# Patient Record
Sex: Female | Born: 1970 | Race: Black or African American | Hispanic: No | Marital: Single | Smoking: Former smoker
Health system: Southern US, Community
[De-identification: ages and names within clinical notes are randomized; demographics above are authoritative.]

## PROBLEM LIST (undated history)

## (undated) DIAGNOSIS — I10 Essential (primary) hypertension: Secondary | ICD-10-CM

## (undated) HISTORY — PX: LIPOMA EXCISION: SHX5283

---

## 2015-05-10 ENCOUNTER — Other Ambulatory Visit: Payer: Self-pay | Admitting: Obstetrics and Gynecology

## 2015-05-15 ENCOUNTER — Encounter (HOSPITAL_COMMUNITY)
Admission: RE | Admit: 2015-05-15 | Discharge: 2015-05-15 | Disposition: A | Discharge status: Home / Self Care | Payer: BLUE CROSS/BLUE SHIELD | Source: Ambulatory Visit | Attending: Obstetrics and Gynecology | Admitting: Obstetrics and Gynecology

## 2015-05-15 ENCOUNTER — Other Ambulatory Visit: Payer: Self-pay

## 2015-05-15 ENCOUNTER — Encounter (HOSPITAL_COMMUNITY): Payer: Self-pay

## 2015-05-15 DIAGNOSIS — D259 Leiomyoma of uterus, unspecified: Secondary | ICD-10-CM | POA: Insufficient documentation

## 2015-05-15 DIAGNOSIS — Z01818 Encounter for other preprocedural examination: Secondary | ICD-10-CM | POA: Diagnosis present

## 2015-05-15 HISTORY — DX: Essential (primary) hypertension: I10

## 2015-05-15 LAB — BASIC METABOLIC PANEL
Anion gap: 3 — ABNORMAL LOW (ref 5–15)
BUN: 7 mg/dL (ref 6–20)
CALCIUM: 8.7 mg/dL — AB (ref 8.9–10.3)
CO2: 25 mmol/L (ref 22–32)
CREATININE: 0.64 mg/dL (ref 0.44–1.00)
Chloride: 107 mmol/L (ref 101–111)
Glucose, Bld: 103 mg/dL — ABNORMAL HIGH (ref 65–99)
Potassium: 3.6 mmol/L (ref 3.5–5.1)
SODIUM: 135 mmol/L (ref 135–145)

## 2015-05-15 LAB — CBC
HCT: 30.7 % — ABNORMAL LOW (ref 36.0–46.0)
Hemoglobin: 9.6 g/dL — ABNORMAL LOW (ref 12.0–15.0)
MCH: 23.8 pg — AB (ref 26.0–34.0)
MCHC: 31.3 g/dL (ref 30.0–36.0)
MCV: 76 fL — ABNORMAL LOW (ref 78.0–100.0)
PLATELETS: 432 10*3/uL — AB (ref 150–400)
RBC: 4.04 MIL/uL (ref 3.87–5.11)
RDW: 17.3 % — ABNORMAL HIGH (ref 11.5–15.5)
WBC: 7.6 10*3/uL (ref 4.0–10.5)

## 2015-05-15 LAB — ABO/RH: ABO/RH(D): O POS

## 2015-05-15 NOTE — Patient Instructions (Addendum)
   Your procedure is scheduled on: OCT 26 [facility])  Enter through the Main Entrance of [facility] at: [place] up the phone at the desk and dial [phone] and inform us of your arrival.  Please call this number if you have any problems the morning of surgery: [phone]   DO NOT EAT  OR DRINK AFTER MIDNIGHT OCT 25 (TUESDAY)  Take these medicines the morning of surgery with a SIP OF WATER: TAKE BLOOD PRESSURE MEDS AM OF SURGERY  Do not wear jewelry, make-up, or FINGER nail polish No metal in your hair or on your body. Do not wear lotions, powders, perfumes.  You may wear deodorant.  Do not bring valuables to the hospital. Contacts, dentures or bridgework may not be worn into surgery.  Leave suitcase in the car. After Surgery it may be brought to your room. For patients being admitted to the hospital, checkout time is 11:00am the day of discharge.

## 2015-05-22 MED ORDER — CEFAZOLIN SODIUM 10 G IJ SOLR
3.0000 g | INTRAMUSCULAR | Status: AC
  Administered 2015-05-23: 3 g via INTRAVENOUS
  Filled 2015-05-22: qty 3000

## 2015-05-22 NOTE — H&P (Signed)
44 y.o. yo G1P0 complains of, on current birth control- norethindrone-, incredibly heavy and painful periods. Last 2 months has had two per month.  No inter-menstrual bleeding.  Was referred to [facility] and told should have hysterectomy. [place] at [facility] 12 weeks size with 6 cm and 8 cm fibroid. EM not seen on [place] but EMB was benign. No pain with SA. Pt has anemia and keeping up but trouble tolerating iron. NO transfusion.  Pt had been seen for possible fertility but AMA was out of range for IVF.  Pt does not desire to preserve uterus and desires definitive.  Past Medical History  Diagnosis Date  . Hypertension    Past Surgical History  Procedure Laterality Date  . Lipoma excision      2005    Social History   Social History  . Marital Status: Single    Spouse Name: N/A  . Number of Children: N/A  . Years of Education: N/A   Occupational History  . Not on file.   Social History Main Topics  . Smoking status: Former Smoker -- 0.50 packs/day    Quit date: 05/15/1991  . Smokeless tobacco: Never Used  . Alcohol Use: No  . Drug Use: No  . Sexual Activity: Not on file   Other Topics Concern  . Not on file   Social History Narrative  . No narrative on file    No current facility-administered medications on file prior to encounter.   No current outpatient prescriptions on file prior to encounter.    Allergies  Allergen Reactions  . Kiwi Extract Itching    throat    @VITALS2 @  Lungs: clear to ascultation Cor:  RRR Abdomen:  soft, nontender, nondistended. Ex:  no cords, erythema Pelvic:  Vulva: no masses, atrophy, or lesions. Vagina: no tenderness, erythema, cystocele, rectocele, abnormal vaginal discharge, or vesicle(s) or ulcers. Cervix: no discharge or cervical motion tenderness and grossly normal. Uterus: enlarged and fibroids and midline, non-tender, and no uterine prolapse. Bladder/Urethra: no urethral discharge or mass and normal meatus, bladder non distended, and Urethra well  supported. Adnexa/Parametria: no parametrial tenderness or mass and no adnexal tenderness or ovarian mass.  A:  Symptomatic fibroid uterus with anemia.  Pt desires definitive.  Will NOT do power morcellation.  For TLH/BSalpingectomies/cysto.  Will have T&C 2 units. Will need to check hands and put in position of function during surgery for ulnar nerve injury.    P: All risks, benefits and alternatives d/w patient and she desires to proceed.  Patient has undergone a modified bowel prep and will receive preop antibiotics and SCDs during the operation.     Lodie Waheed A

## 2015-05-23 ENCOUNTER — Ambulatory Visit (HOSPITAL_COMMUNITY): Payer: BLUE CROSS/BLUE SHIELD | Admitting: Anesthesiology

## 2015-05-23 ENCOUNTER — Encounter (HOSPITAL_COMMUNITY)
Admission: RE | Disposition: A | Discharge status: Home / Self Care | Payer: Self-pay | Source: Ambulatory Visit | Attending: Obstetrics and Gynecology

## 2015-05-23 ENCOUNTER — Ambulatory Visit (HOSPITAL_COMMUNITY)
Admission: RE | Admit: 2015-05-23 | Discharge: 2015-05-24 | Disposition: A | Discharge status: Home / Self Care | Payer: BLUE CROSS/BLUE SHIELD | Source: Ambulatory Visit | Attending: Obstetrics and Gynecology | Admitting: Obstetrics and Gynecology

## 2015-05-23 ENCOUNTER — Encounter (HOSPITAL_COMMUNITY): Payer: Self-pay | Admitting: Anesthesiology

## 2015-05-23 DIAGNOSIS — N99821 Postprocedural hemorrhage and hematoma of a genitourinary system organ or structure following other procedure: Secondary | ICD-10-CM | POA: Diagnosis not present

## 2015-05-23 DIAGNOSIS — Z6841 Body Mass Index (BMI) 40.0 and over, adult: Secondary | ICD-10-CM | POA: Insufficient documentation

## 2015-05-23 DIAGNOSIS — Z87891 Personal history of nicotine dependence: Secondary | ICD-10-CM | POA: Diagnosis not present

## 2015-05-23 DIAGNOSIS — T8131XA Disruption of external operation (surgical) wound, not elsewhere classified, initial encounter: Secondary | ICD-10-CM | POA: Insufficient documentation

## 2015-05-23 DIAGNOSIS — D259 Leiomyoma of uterus, unspecified: Secondary | ICD-10-CM | POA: Insufficient documentation

## 2015-05-23 DIAGNOSIS — D649 Anemia, unspecified: Secondary | ICD-10-CM | POA: Insufficient documentation

## 2015-05-23 DIAGNOSIS — I10 Essential (primary) hypertension: Secondary | ICD-10-CM | POA: Diagnosis not present

## 2015-05-23 DIAGNOSIS — N946 Dysmenorrhea, unspecified: Secondary | ICD-10-CM | POA: Diagnosis not present

## 2015-05-23 DIAGNOSIS — Z9889 Other specified postprocedural states: Secondary | ICD-10-CM

## 2015-05-23 HISTORY — PX: ROBOTIC ASSISTED TOTAL HYSTERECTOMY WITH BILATERAL SALPINGO OOPHERECTOMY: SHX6086

## 2015-05-23 HISTORY — PX: LAPAROSCOPY: SHX197

## 2015-05-23 HISTORY — PX: BILATERAL SALPINGECTOMY: SHX5743

## 2015-05-23 HISTORY — PX: CYSTOSCOPY: SHX5120

## 2015-05-23 LAB — PREGNANCY, URINE: Preg Test, Ur: NEGATIVE

## 2015-05-23 LAB — PREPARE RBC (CROSSMATCH)

## 2015-05-23 SURGERY — ROBOTIC ASSISTED TOTAL HYSTERECTOMY WITH BILATERAL SALPINGO OOPHORECTOMY
Anesthesia: General | Site: Bladder

## 2015-05-23 MED ORDER — KETOROLAC TROMETHAMINE 0.5 % OP SOLN
1.0000 [drp] | Freq: Three times a day (TID) | OPHTHALMIC | Status: AC | PRN
  Filled 2015-05-23: qty 3

## 2015-05-23 MED ORDER — OXYCODONE-ACETAMINOPHEN 5-325 MG PO TABS
1.0000 | ORAL_TABLET | ORAL | Status: DC | PRN
  Administered 2015-05-24: 1 via ORAL
  Filled 2015-05-23: qty 1

## 2015-05-23 MED ORDER — LIDOCAINE HCL (CARDIAC) 20 MG/ML IV SOLN
INTRAVENOUS | Status: AC
  Filled 2015-05-23: qty 5

## 2015-05-23 MED ORDER — MENTHOL 3 MG MT LOZG: 1.0000 | LOZENGE | OROMUCOSAL | Status: DC | PRN

## 2015-05-23 MED ORDER — PROPOFOL 10 MG/ML IV BOLUS
INTRAVENOUS | Status: AC
  Filled 2015-05-23: qty 20

## 2015-05-23 MED ORDER — SODIUM CHLORIDE 0.9 % IJ SOLN
INTRAMUSCULAR | Status: AC
  Filled 2015-05-23: qty 50

## 2015-05-23 MED ORDER — VASOPRESSIN 20 UNIT/ML IV SOLN
INTRAVENOUS | Status: DC | PRN
  Administered 2015-05-23: 20 mL via INTRAMUSCULAR

## 2015-05-23 MED ORDER — DEXAMETHASONE SODIUM PHOSPHATE 10 MG/ML IJ SOLN
INTRAMUSCULAR | Status: DC | PRN
  Administered 2015-05-23: 4 mg via INTRAVENOUS

## 2015-05-23 MED ORDER — SCOPOLAMINE 1 MG/3DAYS TD PT72
1.0000 | MEDICATED_PATCH | Freq: Once | TRANSDERMAL | Status: DC
  Administered 2015-05-23: 1.5 mg via TRANSDERMAL

## 2015-05-23 MED ORDER — LIDOCAINE HCL (CARDIAC) 20 MG/ML IV SOLN
INTRAVENOUS | Status: DC | PRN
  Administered 2015-05-23: 80 mg via INTRAVENOUS

## 2015-05-23 MED ORDER — LACTATED RINGERS IR SOLN
Status: DC | PRN
  Administered 2015-05-23 (×2): 3000 mL

## 2015-05-23 MED ORDER — LACTATED RINGERS IV SOLN
INTRAVENOUS | Status: DC
  Administered 2015-05-23: 125 mL/h via INTRAVENOUS
  Administered 2015-05-23: 13:00:00 via INTRAVENOUS

## 2015-05-23 MED ORDER — NEOSTIGMINE METHYLSULFATE 10 MG/10ML IV SOLN
INTRAVENOUS | Status: DC | PRN
  Administered 2015-05-23: 5 mg via INTRAVENOUS

## 2015-05-23 MED ORDER — ROCURONIUM BROMIDE 100 MG/10ML IV SOLN
INTRAVENOUS | Status: AC
  Filled 2015-05-23: qty 1

## 2015-05-23 MED ORDER — ONDANSETRON HCL 4 MG PO TABS
4.0000 mg | ORAL_TABLET | Freq: Four times a day (QID) | ORAL | Status: DC | PRN
Start: 2015-05-23 — End: 2015-05-24
  Administered 2015-05-24: 4 mg via ORAL
  Filled 2015-05-23: qty 1

## 2015-05-23 MED ORDER — IBUPROFEN 800 MG PO TABS: 800.0000 mg | ORAL_TABLET | Freq: Three times a day (TID) | ORAL | Status: DC | PRN

## 2015-05-23 MED ORDER — BSS IO SOLN
15.0000 mL | Freq: Once | INTRAOCULAR | Status: AC
  Administered 2015-05-23: 15 mL
  Filled 2015-05-23: qty 15

## 2015-05-23 MED ORDER — EPHEDRINE SULFATE 50 MG/ML IJ SOLN
INTRAMUSCULAR | Status: DC | PRN
  Administered 2015-05-23: 10 mg via INTRAVENOUS

## 2015-05-23 MED ORDER — FENTANYL CITRATE (PF) 100 MCG/2ML IJ SOLN
INTRAMUSCULAR | Status: DC | PRN
  Administered 2015-05-23: 50 ug via INTRAVENOUS
  Administered 2015-05-23: 100 ug via INTRAVENOUS
  Administered 2015-05-23: 50 ug via INTRAVENOUS
  Administered 2015-05-23 (×2): 25 ug via INTRAVENOUS
  Administered 2015-05-23: 100 ug via INTRAVENOUS
  Administered 2015-05-23: 50 ug via INTRAVENOUS
  Administered 2015-05-23 (×2): 100 ug via INTRAVENOUS

## 2015-05-23 MED ORDER — ONDANSETRON HCL 4 MG/2ML IJ SOLN
INTRAMUSCULAR | Status: DC | PRN
  Administered 2015-05-23: 4 mg via INTRAVENOUS

## 2015-05-23 MED ORDER — LABETALOL HCL 5 MG/ML IV SOLN: INTRAVENOUS | Status: DC | PRN

## 2015-05-23 MED ORDER — ONDANSETRON HCL 4 MG/2ML IJ SOLN
INTRAMUSCULAR | Status: AC
  Filled 2015-05-23: qty 2

## 2015-05-23 MED ORDER — SUCCINYLCHOLINE CHLORIDE 20 MG/ML IJ SOLN
INTRAMUSCULAR | Status: AC
  Filled 2015-05-23: qty 1

## 2015-05-23 MED ORDER — LISINOPRIL 10 MG PO TABS
10.0000 mg | ORAL_TABLET | Freq: Every day | ORAL | Status: DC
  Administered 2015-05-24: 10 mg via ORAL
  Filled 2015-05-23 (×2): qty 1

## 2015-05-23 MED ORDER — ROPIVACAINE HCL 5 MG/ML IJ SOLN
INTRAMUSCULAR | Status: AC
  Filled 2015-05-23: qty 30

## 2015-05-23 MED ORDER — PHENYLEPHRINE HCL 10 MG/ML IJ SOLN
INTRAMUSCULAR | Status: DC | PRN
  Administered 2015-05-23: 80 ug via INTRAVENOUS
  Administered 2015-05-23 (×6): 40 ug via INTRAVENOUS
  Administered 2015-05-23: 60 ug via INTRAVENOUS
  Administered 2015-05-23: 40 ug via INTRAVENOUS

## 2015-05-23 MED ORDER — SCOPOLAMINE 1 MG/3DAYS TD PT72
MEDICATED_PATCH | TRANSDERMAL | Status: AC
  Filled 2015-05-23: qty 1

## 2015-05-23 MED ORDER — GLYCOPYRROLATE 0.2 MG/ML IJ SOLN
INTRAMUSCULAR | Status: DC | PRN
  Administered 2015-05-23: .8 mg via INTRAVENOUS

## 2015-05-23 MED ORDER — POLYMYXIN B-TRIMETHOPRIM 10000-0.1 UNIT/ML-% OP SOLN
1.0000 [drp] | Freq: Three times a day (TID) | OPHTHALMIC | Status: AC
  Administered 2015-05-23 – 2015-05-24 (×3): 1 [drp] via OPHTHALMIC
  Filled 2015-05-23: qty 10

## 2015-05-23 MED ORDER — MIDAZOLAM HCL 2 MG/2ML IJ SOLN
INTRAMUSCULAR | Status: AC
  Filled 2015-05-23: qty 4

## 2015-05-23 MED ORDER — ROCURONIUM BROMIDE 100 MG/10ML IV SOLN
INTRAVENOUS | Status: DC | PRN
  Administered 2015-05-23: 5 mg via INTRAVENOUS
  Administered 2015-05-23 (×3): 10 mg via INTRAVENOUS
  Administered 2015-05-23: 5 mg via INTRAVENOUS
  Administered 2015-05-23: 10 mg via INTRAVENOUS
  Administered 2015-05-23: 45 mg via INTRAVENOUS
  Administered 2015-05-23: 10 mg via INTRAVENOUS
  Administered 2015-05-23: 5 mg via INTRAVENOUS

## 2015-05-23 MED ORDER — SUCCINYLCHOLINE CHLORIDE 20 MG/ML IJ SOLN
INTRAMUSCULAR | Status: DC | PRN
  Administered 2015-05-23: 160 mg via INTRAVENOUS

## 2015-05-23 MED ORDER — DEXAMETHASONE SODIUM PHOSPHATE 4 MG/ML IJ SOLN
INTRAMUSCULAR | Status: AC
  Filled 2015-05-23: qty 1

## 2015-05-23 MED ORDER — KETOROLAC TROMETHAMINE 30 MG/ML IJ SOLN: 30.0000 mg | Freq: Four times a day (QID) | INTRAMUSCULAR | Status: DC

## 2015-05-23 MED ORDER — HYDROMORPHONE HCL 1 MG/ML IJ SOLN
INTRAMUSCULAR | Status: AC
  Administered 2015-05-23: 0.25 mg via INTRAVENOUS
  Filled 2015-05-23: qty 1

## 2015-05-23 MED ORDER — PROPOFOL 10 MG/ML IV BOLUS
INTRAVENOUS | Status: DC | PRN
  Administered 2015-05-23: 50 mg via INTRAVENOUS
  Administered 2015-05-23: 200 mg via INTRAVENOUS
  Administered 2015-05-23: 50 mg via INTRAVENOUS

## 2015-05-23 MED ORDER — PHENYLEPHRINE 40 MCG/ML (10ML) SYRINGE FOR IV PUSH (FOR BLOOD PRESSURE SUPPORT)
PREFILLED_SYRINGE | INTRAVENOUS | Status: AC
  Filled 2015-05-23: qty 20

## 2015-05-23 MED ORDER — SODIUM CHLORIDE 0.9 % IV SOLN
INTRAVENOUS | Status: DC | PRN
  Administered 2015-05-23: 60 mL

## 2015-05-23 MED ORDER — MIDAZOLAM HCL 2 MG/2ML IJ SOLN
INTRAMUSCULAR | Status: DC | PRN
  Administered 2015-05-23: 2 mg via INTRAVENOUS

## 2015-05-23 MED ORDER — FENTANYL CITRATE (PF) 100 MCG/2ML IJ SOLN
INTRAMUSCULAR | Status: AC
  Filled 2015-05-23: qty 4

## 2015-05-23 MED ORDER — METOCLOPRAMIDE HCL 5 MG/ML IJ SOLN
INTRAMUSCULAR | Status: AC
  Filled 2015-05-23: qty 2

## 2015-05-23 MED ORDER — ONDANSETRON HCL 4 MG/2ML IJ SOLN: 4.0000 mg | Freq: Four times a day (QID) | INTRAMUSCULAR | Status: DC | PRN

## 2015-05-23 MED ORDER — PHENYLEPHRINE 40 MCG/ML (10ML) SYRINGE FOR IV PUSH (FOR BLOOD PRESSURE SUPPORT)
PREFILLED_SYRINGE | INTRAVENOUS | Status: AC
  Filled 2015-05-23: qty 10

## 2015-05-23 MED ORDER — EPHEDRINE 5 MG/ML INJ
INTRAVENOUS | Status: AC
  Filled 2015-05-23: qty 10

## 2015-05-23 MED ORDER — STERILE WATER FOR IRRIGATION IR SOLN
Status: DC | PRN
  Administered 2015-05-23: 1000 mL via INTRAVESICAL

## 2015-05-23 MED ORDER — ARTIFICIAL TEARS OP OINT
TOPICAL_OINTMENT | OPHTHALMIC | Status: AC
  Filled 2015-05-23: qty 3.5

## 2015-05-23 MED ORDER — GLYCOPYRROLATE 0.2 MG/ML IJ SOLN
INTRAMUSCULAR | Status: AC
  Filled 2015-05-23: qty 4

## 2015-05-23 MED ORDER — FENTANYL CITRATE (PF) 250 MCG/5ML IJ SOLN
INTRAMUSCULAR | Status: AC
  Filled 2015-05-23: qty 25

## 2015-05-23 MED ORDER — SODIUM CHLORIDE 0.9 % IJ SOLN
INTRAMUSCULAR | Status: AC
  Filled 2015-05-23: qty 10

## 2015-05-23 MED ORDER — KETOROLAC TROMETHAMINE 30 MG/ML IJ SOLN
INTRAMUSCULAR | Status: DC | PRN
Start: 2015-05-23 — End: 2015-05-23
  Administered 2015-05-23: 30 mg via INTRAVENOUS

## 2015-05-23 MED ORDER — HYDROMORPHONE HCL 1 MG/ML IJ SOLN
0.2500 mg | INTRAMUSCULAR | Status: DC | PRN
  Administered 2015-05-23 (×2): 0.25 mg via INTRAVENOUS

## 2015-05-23 MED ORDER — ARTIFICIAL TEARS OP OINT
TOPICAL_OINTMENT | OPHTHALMIC | Status: DC | PRN
  Administered 2015-05-23: 1 via OPHTHALMIC

## 2015-05-23 MED ORDER — VASOPRESSIN 20 UNIT/ML IV SOLN
INTRAVENOUS | Status: AC
  Filled 2015-05-23: qty 1

## 2015-05-23 MED ORDER — KETOROLAC TROMETHAMINE 30 MG/ML IJ SOLN
INTRAMUSCULAR | Status: AC
  Filled 2015-05-23: qty 1

## 2015-05-23 MED ORDER — MEPERIDINE HCL 25 MG/ML IJ SOLN: 6.2500 mg | INTRAMUSCULAR | Status: DC | PRN

## 2015-05-23 MED ORDER — ACETAMINOPHEN 10 MG/ML IV SOLN
1000.0000 mg | Freq: Once | INTRAVENOUS | Status: AC
  Administered 2015-05-23: 1000 mg via INTRAVENOUS
  Filled 2015-05-23: qty 100

## 2015-05-23 MED ORDER — METOCLOPRAMIDE HCL 5 MG/ML IJ SOLN
10.0000 mg | Freq: Once | INTRAMUSCULAR | Status: AC | PRN
  Administered 2015-05-23: 10 mg via INTRAVENOUS

## 2015-05-23 MED ORDER — LABETALOL HCL 5 MG/ML IV SOLN
INTRAVENOUS | Status: AC
  Filled 2015-05-23: qty 4

## 2015-05-23 MED ORDER — LABETALOL HCL 5 MG/ML IV SOLN
INTRAVENOUS | Status: DC | PRN
  Administered 2015-05-23 (×3): 5 mg via INTRAVENOUS

## 2015-05-23 SURGICAL SUPPLY — 83 items
BARRIER ADHS 3X4 INTERCEED (GAUZE/BANDAGES/DRESSINGS) IMPLANT
BENZOIN TINCTURE PRP APPL 2/3 (GAUZE/BANDAGES/DRESSINGS) IMPLANT
CANISTER SUCT 3000ML (MISCELLANEOUS) ×5 IMPLANT
CATH ROBINSON RED A/P 16FR (CATHETERS) IMPLANT
CLOSURE WOUND 1/2 X4 (GAUZE/BANDAGES/DRESSINGS)
CLOTH BEACON ORANGE TIMEOUT ST (SAFETY) ×5 IMPLANT
CONT PATH 16OZ SNAP LID 3702 (MISCELLANEOUS) ×5 IMPLANT
COVER BACK TABLE 60X90IN (DRAPES) ×10 IMPLANT
COVER TIP SHEARS 8 DVNC (MISCELLANEOUS) ×3 IMPLANT
COVER TIP SHEARS 8MM DA VINCI (MISCELLANEOUS) ×2
DECANTER SPIKE VIAL GLASS SM (MISCELLANEOUS) ×10 IMPLANT
DEFOGGER SCOPE WARMER CLEARIFY (MISCELLANEOUS) ×5 IMPLANT
DRAPE ROBOTICS STRL (DRAPES) ×5 IMPLANT
DRAPE WARM FLUID 44X44 (DRAPE) IMPLANT
DRSG COVADERM PLUS 2X2 (GAUZE/BANDAGES/DRESSINGS) ×20 IMPLANT
DRSG OPSITE POSTOP 3X4 (GAUZE/BANDAGES/DRESSINGS) ×5 IMPLANT
DURAPREP 26ML APPLICATOR (WOUND CARE) ×10 IMPLANT
ELECT REM PT RETURN 9FT ADLT (ELECTROSURGICAL) ×5
ELECTRODE REM PT RTRN 9FT ADLT (ELECTROSURGICAL) ×3 IMPLANT
FORCEPS CUTTING 33CM 5MM (CUTTING FORCEPS) ×5 IMPLANT
GAUZE VASELINE 3X9 (GAUZE/BANDAGES/DRESSINGS) IMPLANT
GLOVE BIO SURGEON STRL SZ 6.5 (GLOVE) ×4 IMPLANT
GLOVE BIO SURGEON STRL SZ7 (GLOVE) ×10 IMPLANT
GLOVE BIO SURGEONS STRL SZ 6.5 (GLOVE) ×1
GLOVE BIOGEL PI IND STRL 6.5 (GLOVE) ×3 IMPLANT
GLOVE BIOGEL PI IND STRL 7.0 (GLOVE) ×6 IMPLANT
GLOVE BIOGEL PI INDICATOR 6.5 (GLOVE) ×2
GLOVE BIOGEL PI INDICATOR 7.0 (GLOVE) ×4
GLOVE ECLIPSE 6.5 STRL STRAW (GLOVE) ×15 IMPLANT
GOWN STRL REUS W/TWL LRG LVL3 (GOWN DISPOSABLE) ×10 IMPLANT
GYRUS RUMI II 2.5CM BLUE (DISPOSABLE)
GYRUS RUMI II 3.5CM BLUE (DISPOSABLE) ×5
GYRUS RUMI II 4.0CM BLUE (DISPOSABLE)
KIT ACCESSORY DA VINCI DISP (KITS) ×2
KIT ACCESSORY DVNC DISP (KITS) ×3 IMPLANT
LEGGING LITHOTOMY PAIR STRL (DRAPES) ×5 IMPLANT
LIQUID BAND (GAUZE/BANDAGES/DRESSINGS) ×5 IMPLANT
MANIPULATOR UTERINE 4.5 ZUMI (MISCELLANEOUS) IMPLANT
NEEDLE INSUFFLATION 120MM (ENDOMECHANICALS) ×5 IMPLANT
NEEDLE INSUFFLATION 14GA 150MM (NEEDLE) ×5 IMPLANT
NEEDLE INSUFFLATION 150MM (ENDOMECHANICALS) ×5 IMPLANT
OCCLUDER COLPOPNEUMO (BALLOONS) ×5 IMPLANT
PACK LAPAROSCOPY BASIN (CUSTOM PROCEDURE TRAY) ×5 IMPLANT
PACK ROBOT WH (CUSTOM PROCEDURE TRAY) ×5 IMPLANT
PACK ROBOTIC GOWN (GOWN DISPOSABLE) ×5 IMPLANT
PACK VAGINAL WOMENS (CUSTOM PROCEDURE TRAY) ×5 IMPLANT
PAD POSITIONING PINK XL (MISCELLANEOUS) ×10 IMPLANT
PAD PREP 24X48 CUFFED NSTRL (MISCELLANEOUS) ×10 IMPLANT
RUMI II 3.0CM BLUE KOH-EFFICIE (DISPOSABLE) IMPLANT
RUMI II GYRUS 2.5CM BLUE (DISPOSABLE) IMPLANT
RUMI II GYRUS 3.5CM BLUE (DISPOSABLE) ×3 IMPLANT
RUMI II GYRUS 4.0CM BLUE (DISPOSABLE) IMPLANT
SET CYSTO W/LG BORE CLAMP LF (SET/KITS/TRAYS/PACK) ×5 IMPLANT
SET IRRIG TUBING LAPAROSCOPIC (IRRIGATION / IRRIGATOR) ×10 IMPLANT
SET TRI-LUMEN FLTR TB AIRSEAL (TUBING) ×5 IMPLANT
STRIP CLOSURE SKIN 1/2X4 (GAUZE/BANDAGES/DRESSINGS) IMPLANT
SUT DVC VLOC 180 0 12IN GS21 (SUTURE)
SUT VIC AB 0 CT1 27 (SUTURE)
SUT VIC AB 0 CT1 27XBRD ANBCTR (SUTURE) IMPLANT
SUT VIC AB 2-0 CT2 27 (SUTURE) ×10 IMPLANT
SUT VIC AB 2-0 UR6 27 (SUTURE) ×20 IMPLANT
SUT VICRYL RAPIDE 3 0 (SUTURE) ×30 IMPLANT
SUT VLOC 180 0 9IN  GS21 (SUTURE) ×2
SUT VLOC 180 0 9IN GS21 (SUTURE) ×3 IMPLANT
SUTURE DVC VLC 180 0 12IN GS21 (SUTURE) IMPLANT
SYR 50ML LL SCALE MARK (SYRINGE) ×5 IMPLANT
SYSTEM CONVERTIBLE TROCAR (TROCAR) IMPLANT
TIP RUMI ORANGE 6.7MMX12CM (TIP) IMPLANT
TIP UTERINE 5.1X6CM LAV DISP (MISCELLANEOUS) IMPLANT
TIP UTERINE 6.7X10CM GRN DISP (MISCELLANEOUS) IMPLANT
TIP UTERINE 6.7X6CM WHT DISP (MISCELLANEOUS) IMPLANT
TIP UTERINE 6.7X8CM BLUE DISP (MISCELLANEOUS) ×10 IMPLANT
TOWEL OR 17X24 6PK STRL BLUE (TOWEL DISPOSABLE) ×10 IMPLANT
TRAY FOLEY CATH SILVER 14FR (SET/KITS/TRAYS/PACK) ×10 IMPLANT
TROCAR 12M 150ML BLUNT (TROCAR) ×5 IMPLANT
TROCAR DILATING TIP 12MM 150MM (ENDOMECHANICALS) ×5 IMPLANT
TROCAR DISP BLADELESS 8 DVNC (TROCAR) ×3 IMPLANT
TROCAR DISP BLADELESS 8MM (TROCAR) ×2
TROCAR PORT AIRSEAL 5X120 (TROCAR) IMPLANT
TROCAR XCEL 12X100 BLDLESS (ENDOMECHANICALS) IMPLANT
TROCAR XCEL DIL TIP R 11M (ENDOMECHANICALS) ×5 IMPLANT
TROCAR XCEL NON-BLD 5MMX100MML (ENDOMECHANICALS) ×5 IMPLANT
WATER STERILE IRR 1000ML POUR (IV SOLUTION) IMPLANT

## 2015-05-23 NOTE — Transfer of Care (Signed)
Immediate Anesthesia Transfer of Care Note  Patient: Kristin Skinner  Procedure(s) Performed: Procedure(s): ROBOTIC ASSISTED TOTAL HYSTERECTOMY  (Bilateral) BILATERAL SALPINGECTOMY (Bilateral) CYSTOSCOPY (N/A) LAPAROSCOPY DIAGNOSTIC WITH SUTURE OF VAGINAL CUFF (N/A)  Patient Location: PACU  Anesthesia Type:General  Level of Consciousness: awake, alert  and oriented  Airway & Oxygen Therapy: Patient Spontanous Breathing and Patient connected to nasal cannula oxygen  Post-op Assessment: Report given to RN and Post -op Vital signs reviewed and stable  Post vital signs: Reviewed and stable  Last Vitals:  Filed Vitals:   05/23/15 0903  BP: 141/90  Pulse: 87  Temp: 37.1 C  Resp: 20    Complications: No apparent anesthesia complications

## 2015-05-23 NOTE — Anesthesia Postprocedure Evaluation (Addendum)
Anesthesia Post Note  Patient: Kristin Skinner  Procedure(s) Performed: Procedure(s) (LRB): ROBOTIC ASSISTED TOTAL HYSTERECTOMY  (Bilateral) BILATERAL SALPINGECTOMY (Bilateral) CYSTOSCOPY (N/A) LAPAROSCOPY DIAGNOSTIC WITH SUTURE OF VAGINAL CUFF (N/A)  Anesthesia type: General  Patient location: PACU  Post pain: Pain level controlled  Post assessment: Post-op Vital signs reviewed  Last Vitals:  Filed Vitals:   05/23/15 1800  BP: 110/61  Pulse: 77  Temp:   Resp: 12    Post vital signs: Reviewed  Level of consciousness: sedated  Complications: No apparent anesthesia complications. Pt may have a mild corneal abrasion in her right eye. I have placed her on the anesthesia postoperative protocol to monitor.

## 2015-05-23 NOTE — Op Note (Signed)
05/23/2015  5:11 PM  PATIENT:  Kristin Skinner  44 y.o. female  PRE-OPERATIVE DIAGNOSIS:  Post operative bleeding from vagina.  POST-OPERATIVE DIAGNOSIS:  Same with small area of cuff dehiscence.  PROCEDURE:  Diagnostic laparoscopy with vaginal repair of cuff dehiscence. SURGEON:  Surgeon(s) and Role:    * Bobbye Charleston, MD - Primary  ANESTHESIA: continued  general from previous  EBL: see anethesia for IVF.  Blood loss from dehiscence about 100 cc.  BLOOD ADMINISTERED:none  DRAINS: none   LOCAL MEDICATIONS USED:  NONE  SPECIMEN:  No Specimen  DISPOSITION OF SPECIMEN:  N/A  COUNTS:  YES  TOURNIQUET:  * No tourniquets in log *  DICTATION: .Note written in EPIC  PLAN OF CARE: Admit for overnight observation  PATIENT DISPOSITION:  PACU - hemodynamically stable.   Delay start of Pharmacological VTE agent (>24hrs) due to surgical blood loss or risk of bleeding: not applicable  Complications: none.  Findings:  No active bleeding from vessels in pelvis.  2 cm area of dehiscence of vaginal cuff at angle on L.  V-Loc suture unraveled one turn- rest of cuff intact to palpation and exploration with ring forceps.  Reason for operation;  Shortly after I left the operating room from the first surgery and before the pt was awakened from the first cast, I was called back for brisk bleeding from the vagina.  Indeed there was a brisk trickle of BRB coming from the L side of the vaginal cuff.  During the previous operation, the cuff had been checked digitally and the abdomen checked several times with the gas down with no active bleeding.  I first inspected the vagina with a speculum and could see a brisk trickle of bright blood from the L cuff angle.  I could not identify a vessel from below.  Since I could not see where the bleeding was coming from now and elected to proceed with diagnostic laparoscopy in case a vessel inside the pelvis was no longer hemostatic.     Techniques:  Pt was prepped and draped again in a sterile fashion.  The foley had remained in place and a sponge stick was used to manipulate the vaginal cuff.  The 12 mm scope port was wiped of dermabond and the skin reopened.  I could not find the former suture line from the 12 scope to open; a veress needle was tried 2 times without peritoneal insufflation.  An Optiview was then placed through the same skin incision but a new fascial defect made and the abdomen insufflated and the camera introduced.  The right sided 8.5 port skin was opened and a 5 port placed. The cuff and lower peritoneum were irrigated and watched.  The gas was turned down to 8 again and still we watched.  A systematic evaluation of the pedicles and peritoneal edges was made with no sign of bleeding.  I moved to the vagina to examine it while watching from above and noted a 3 cm area of open cuff at the L angle.  Upon having the scrub tech expose the posterior vagina and cul de sac, a 3 cm length of V loc had become unraveled from the cuff.  Bleeding, although much less, could still be seen from the L angle of the cuff.  Two figure of eight stitches were placed from the vagina into the cuff to close the angle and bleeding stopped.  I redressed and went back to the abdomen and again inspected the pedicles, cuff and  peritoneum- no active bleeding was seen.  An inspection with ring forceps was performed of the cuff from the vagina and no areas of dehiscence were seen.  No blood from the vagina was seen either.  The instruments were removed from the abdomen, the abdomen desufflated and the the fascial incision was closed with a 2- vicryl figure of eight.  The skin incisions were closed with 4-0 vicryl and dermabond.  The vagina was clear and hemostatic.  The pt was returned to the recovery room in stable condition.

## 2015-05-23 NOTE — Addendum Note (Signed)
Addendum  created 05/23/15 1853 by Lyn Hollingshead, MD   Modules edited: Notes Section   Notes Section:  File: [id number]

## 2015-05-23 NOTE — Anesthesia Procedure Notes (Signed)
Procedure Name: Intubation Date/Time: 05/23/2015 11:30 AM Performed by: Raenette Rover Pre-anesthesia Checklist: Patient identified, Emergency Drugs available, Suction available and Patient being monitored Patient Re-evaluated:Patient Re-evaluated prior to inductionOxygen Delivery Method: Circle system utilized Preoxygenation: Pre-oxygenation with 100% oxygen Intubation Type: IV induction and Rapid sequence Ventilation: Mask ventilation without difficulty Laryngoscope Size: Mac and 3 Grade View: Grade II Tube type: Oral Tube size: 7.0 mm Number of attempts: 2 (1 by CRNA with Sabra Heck 3--Grade 4 view; 1 by MDA with MAC 3--Grade 2 view) Airway Equipment and Method: Patient positioned with wedge pillow and Stylet Placement Confirmation: ETT inserted through vocal cords under direct vision,  breath sounds checked- equal and bilateral,  positive ETCO2 and CO2 detector Secured at: 20 cm Tube secured with: Tape Dental Injury: Teeth and Oropharynx as per pre-operative assessment

## 2015-05-23 NOTE — Brief Op Note (Addendum)
05/23/2015  4:57 PM  PATIENT:  Kristin Skinner  44 y.o. female  PRE-OPERATIVE DIAGNOSIS:  FIBROIDS  POST-OPERATIVE DIAGNOSIS:  FIBROIDS  PROCEDURE:  Procedure(s): ROBOTIC ASSISTED TOTAL HYSTERECTOMY  (Bilateral) BILATERAL SALPINGECTOMY (Bilateral) CYSTOSCOPY (N/A) LAPAROSCOPY DIAGNOSTIC WITH SUTURE OF VAGINAL CUFF (N/A)  SURGEON:  Surgeon(s) and Role:    * Bobbye Charleston, MD - Primary    * Sanjuana Kava, MD - Assisting   ANESTHESIA:   general  EBL:  Total I/O In: 1900 [I.V.:1900] Out: 400 [Urine:200; Blood:200]   LOCAL MEDICATIONS USED:  OTHER ropivicaine  SPECIMEN:  No Specimen and Source of Specimen:  uterus, cervix and tubes  DISPOSITION OF SPECIMEN:  PATHOLOGY  COUNTS:  YES  TOURNIQUET:  * No tourniquets in log *  DICTATION: .Note written in EPIC  PLAN OF CARE: Admit for overnight observation  PATIENT DISPOSITION:  PACU - hemodynamically stable.   Delay start of Pharmacological VTE agent (>24hrs) due to surgical blood loss or risk of bleeding: not applicable  Complications:  None.  Findings:  15 weeks size uterus with two large fibroids.  R and L ovaries were normal..  The ureters were identified during multiple points of the case and were always out of the field of dissection.  On cystoscopy, the bladder was intact and bilateral spill was seen from each ureteral orriface.    Medications:  Ancef.       Technique:  After adequate anesthesia was achieved the patient was positioned, prepped and draped in usual sterile fashion.  A speculum was placed in the vagina and the cervix dilated with pratt dilators.  The 8 cm Rumi and 3.5 cm Koh ring were assembled and placed in proper fashion.  The  Speculum was removed and the bladder catheterized with a foley.    Attention was turned to the abdomen where a 1 cm incision was made 1 cm above the umbilicus.  The veress needle was introduced without aspiration of bowel contents or blood and the abdomen insufflated. The  long 12 mm trocar was placed and the other three trocar sites were marked out, all approximately 10 cm from each other and the camera.  Two 8.5 mm trocars were placed on either side of the camera port and a 5 mm assistant port was placed 3 cm above the line of the other trocars in the left upper quadrant.  All trocars were inserted under direct visualization of the camera.  The patient was placed in trendelenburg and then the Robot docked.  The PK forceps were placed on arm 2 and the Hot shears on arm 1 and introduced under direct visualization of the camera.  I then broke scrub and sat down at the console.  The above findings were noted and the ureters identified well out of the field of dissection.  The right fallopian tube was isolated and cauterized with the PK.  The Utero-ovarian ligament was then divided with the PK cautery and shears.  The posterior broad ligament was then divided with the hot shears until the uterosacral ligament.  The Broad and cardinal ligaments were then cauterized against the cervix to the level of the Koh ring, securing the uterine artery.  Each pedicle was then incised with the shears.  The anterior leaf was then incised at the reflection of the vessico-uterine junction and the lateral bladder retracted inferiorly after the round ligament had been divided with the PK forceps.  The left tube was cauterized with the PK and divided with the shears;  then  the left utero-ovarian ligament divided with the PK forceps and the scissors.  The round ligament was divided as well and the posterior leaf of the broad ligament then divided with the hot shears. The broad and cardinal ligaments were then cauterized on the left in the same way.   At the level of the internal os, the uterine arteries were bilaterally cauterized with the PK.  The ureters were identified well out of the field of dissection.  .   The bladder was then able to be retracted inferiorly and the vesico-uterine fascia was  incised in the midline until the bladder was removed one cm below the Koh ring.  The hot shears and PK were then used to perform tow myomectomies with the fibroids relatively intact to make the uterine body smaller.  The hot shears then circumferentially incised the vagina at the level of the reflection on the [facility] ring.  The uterus and both fibroids were removed from the peritoneal cavity through the vagina.  Once the uterus and cervix were amputated, cautery was not needed to insure hemostasis of the cuff.  Once hemostasis was achieved, the instruments were changed to the long forceps and mega suture cut needle driver and the cuff was closed with a running stitches of 0-vicryl V loc.  Cautery was used to ensure hemostasis of the left pedicles very superficially after a 2-0 vicryl suture was attempted to help hemostasis.  The ureters were peristalsing bilaterally well and very lateral to the areas of operation.    The Robot was then undocked and I scrubbed back in.    The skin incisions were closed with subcuticular stitches of 3-0 vicryl Rapide and Dermabond.  All instruments were removed from the vagina and cystoscopy performed, revealing an intact bladder and vigourous spill of indigo carmine from each ureteral orifice.  The cystoscope was removed and the patient taken to the recovery room in stable condition.  Wannetta Langland A

## 2015-05-23 NOTE — Op Note (Addendum)
05/23/2015  4:57 PM  PATIENT:  Kristin Skinner  44 y.o. female  PRE-OPERATIVE DIAGNOSIS:  FIBROIDS  POST-OPERATIVE DIAGNOSIS:  FIBROIDS  PROCEDURE:  Procedure(s): ROBOTIC ASSISTED TOTAL HYSTERECTOMY  (Bilateral) BILATERAL SALPINGECTOMY (Bilateral) CYSTOSCOPY (N/Skinner) LAPAROSCOPY DIAGNOSTIC WITH SUTURE OF VAGINAL CUFF (N/Skinner)  SURGEON:  Surgeon(s) and Role:    * Bobbye Charleston, MD - Primary    * Sanjuana Kava, MD - Assisting   ANESTHESIA:   general  EBL:  Total I/O In: 1900 [I.V.:1900] Out: 400 [Urine:200; Blood:200]   LOCAL MEDICATIONS USED:  OTHER ropivicaine  SPECIMEN:  No Specimen and Source of Specimen:  uterus, cervix and tubes  DISPOSITION OF SPECIMEN:  PATHOLOGY  COUNTS:  YES  TOURNIQUET:  * No tourniquets in log *  DICTATION: .Note written in EPIC  PLAN OF CARE: Admit for overnight observation  PATIENT DISPOSITION:  PACU - hemodynamically stable.   Delay start of Pharmacological VTE agent (>24hrs) due to surgical blood loss or risk of bleeding: not applicable  Complications:  None.  Findings:  15 weeks size uterus with two large fibroids.  R and L ovaries were normal..  The ureters were identified during multiple points of the case and were always out of the field of dissection.  On cystoscopy, the bladder was intact and bilateral spill was seen from each ureteral orriface.    Medications:  Ancef.       Technique:  After adequate anesthesia was achieved the patient was positioned, prepped and draped in usual sterile fashion.  Skinner speculum was placed in the vagina and the cervix dilated with pratt dilators.  The 8 cm Rumi and 3.5 cm Koh ring were assembled and placed in proper fashion.  The  Speculum was removed and the bladder catheterized with Skinner foley.    Attention was turned to the abdomen where Skinner 1 cm incision was made 1 cm above the umbilicus.  The veress needle was introduced without aspiration of bowel contents or blood and the abdomen insufflated. The  long 12 mm trocar was placed and the other three trocar sites were marked out, all approximately 10 cm from each other and the camera.  Two 8.5 mm trocars were placed on either side of the camera port and Skinner 5 mm assistant port was placed 3 cm above the line of the other trocars in the left upper quadrant.  All trocars were inserted under direct visualization of the camera.  The patient was placed in trendelenburg and then the Robot docked.  The PK forceps were placed on arm 2 and the Hot shears on arm 1 and introduced under direct visualization of the camera.  I then broke scrub and sat down at the console.  The above findings were noted and the ureters identified well out of the field of dissection.  The right fallopian tube was isolated and cauterized with the PK.  The Utero-ovarian ligament was then divided with the PK cautery and shears.  The posterior broad ligament was then divided with the hot shears until the uterosacral ligament.  The Broad and cardinal ligaments were then cauterized against the cervix to the level of the Koh ring, securing the uterine artery.  Each pedicle was then incised with the shears.  The anterior leaf was then incised at the reflection of the vessico-uterine junction and the lateral bladder retracted inferiorly after the round ligament had been divided with the PK forceps.  The left tube was cauterized with the PK and divided with the shears;  then  the left utero-ovarian ligament divided with the PK forceps and the scissors.  The round ligament was divided as well and the posterior leaf of the broad ligament then divided with the hot shears. The broad and cardinal ligaments were then cauterized on the left in the same way.   At the level of the internal os, the uterine arteries were bilaterally cauterized with the PK.  The ureters were identified well out of the field of dissection.  .   The bladder was then able to be retracted inferiorly and the vesico-uterine fascia was  incised in the midline until the bladder was removed one cm below the Koh ring.  The hot shears and PK were then used to perform two myomectomies with the fibroids relatively intact to make the uterine body smaller.  The hot shears then circumferentially incised the vagina at the level of the reflection on the [facility] ring.  The uterus and both fibroids were removed from the peritoneal cavity through the vagina.  Once the uterus and cervix were amputated, cautery was not needed to insure hemostasis of the cuff.  Once hemostasis was achieved, the instruments were changed to the long forceps and mega suture cut needle driver and the cuff was closed with Skinner running stitches of 0-vicryl V loc.  Cautery was used to ensure hemostasis of the left pedicles very superficially after Skinner 2-0 vicryl suture was attempted to help hemostasis.  The ureters were peristalsing bilaterally well and very lateral to the areas of operation.    The Robot was then undocked and I scrubbed back in.    The skin incisions were closed with subcuticular stitches of 3-0 vicryl Rapide and Dermabond.  All instruments were removed from the vagina and cystoscopy performed, revealing an intact bladder and vigourous spill of indigo carmine from each ureteral orifice.  The cystoscope was removed and the foley replaced.   Kristin Skinner

## 2015-05-23 NOTE — Op Note (Deleted)
05/23/2015  10:34 AM  PATIENT:  Kristin Skinner  44 y.o. female  PRE-OPERATIVE DIAGNOSIS:  FIBROIDS  POST-OPERATIVE DIAGNOSIS:  * No post-op diagnosis entered *  PROCEDURE:  Procedure(s): ROBOTIC ASSISTED TOTAL HYSTERECTOMY WITH POSSIBLE BILATERAL SALPINGO OOPHORECTOMY (Bilateral) BILATERAL SALPINGECTOMY (Bilateral) CYSTOSCOPY (N/A)  SURGEON:  Surgeon(s) and Role:    * Bobbye Charleston, MD - Primary  PHYSICIAN ASSISTANT:   ASSISTANTS: Dr. Deatra Ina   ANESTHESIA:   general  EBL:   100 cc  LOCAL MEDICATIONS USED:  OTHER Ropivicaine  SPECIMEN:  Source of Specimen:  uterus, cervix, tubes and ovaries  DISPOSITION OF SPECIMEN:  PATHOLOGY  COUNTS:  YES  TOURNIQUET:  * No tourniquets in log *  DICTATION: .Note written in EPIC  PLAN OF CARE: Admit for overnight observation  PATIENT DISPOSITION:  PACU - hemodynamically stable.   Delay start of Pharmacological VTE agent (>24hrs) due to surgical blood loss or risk of bleeding: not applicable  Complications:  None.  Findings: 9 weeks size uterus.  Ovaries were normal bilaterally.  The ureters were identified during multiple points of the case and were always out of the field of dissection.  On cystoscopy, the bladder was intact and bilateral spill was seen from each ureteral oriface.  On dissection of the vaginal mucosa off the vesicovaginal tissue, an area was dissected very closely to the L urethra- immediate cystoscopy proved that the urethra was intact and the area was reinforced with 3-0 vicryl before proceeding with TVT.  Medications:  Ancef. Pyridium preop. Exparel  intra peritoneal.  Technique:  After adequate anesthesia was achieved the patient was positioned, prepped and draped in usual sterile fashion.  A speculum was placed in the vagina and the cervix dilated with pratt dilators.  The 8 cm Rumi and 3 cm Koh ring were assembled and placed in proper fashion.  The  Speculum was removed and the bladder catheterized  with a foley.    Attention was turned to the abdomen where a 1 cm incision was made above the umbilicus.  The veress needle was inserted without aspiration of bowel contents or blood.  The long 12 mm trocar was placed and the other three trocar sites were marked out, all approximately 10 cm from each other and the camera. Two 8.5 mm trocars were placed on either side of the camera port.  A 5 mm assistant port was placed 3 cm above the left iliac crest.  All trocars were inserted under direct visualization of the camera.  The patient was placed in trendelenburg and then the Robot docked.  The PK forceps were placed on arm 2 and the Hot shears on arm 1 and introduced under direct visualization of the camera.   I then broke scrub and sat down at the console.  The above findings were noted and the ureters identified well out of the field of dissection.  The right round ligament was identified and cauterized with the PK.  It  was then divided with the PK cautery and shears.  The peritoneum parallel to the IP ligament was carefully incised and the posterior broad ligament was then incised under the right IP ligament with care to be above the ureter.  The R ureter was dissected and followed to the junction with the uterine artery.  The R IP ligament was cauterized with the PK and then incised with the shears.  The Broad and cardinal ligaments were then skeletonized against the cervix to the level of the Koh ring, exposing the uterine  artery.  The uterine artery and branches were cauterized with the PK and incised with the shears.  The anterior leaf was then incised at the reflection of the vessico-uterine junction and the lateral bladder retracted inferiorly after the round ligament had been divided with the PK forceps.  The round ligament was cauterized with the PK and divided with the shears;  then the left IP ligament divided with the PK forceps and the scissors in the same manner as the right. The broad and  cardinal ligaments were then skeletonized and cauterized on the left in the same way.  The ureters were identified well out of the field of dissection.   The anterior peritoneum was tented up and incised with the monopolar and the bladder retracted inferiorly.  The vesicovaginal fascia was incised with the monopolar shears and then pushed inferiorly off the vagina to about one cm below the Koh ring.   The uterus was amputated at the level of the reflection of the vagina onto the cervix with monopolar cautery and then removed through the vagina.  The pedicles were checked with the gas pressure down and found to be hemostatic.  The vaginal cuff was closed with a running stitch of 2-0 V-Loc.  The Robot was undocked and Ropvicaine instilled inside the peritoneal cavity at the cuff.  All instruments and trocars were withdrawn from the abdomen, the abdomen desufflated and the 12 mm fascial incision closed with a figure of eight stitch of 2-0 vicryl.  The skin incisions were closed with 4-0 vicryl R and dermabond.  The cystoscope was placed in the bladder and good spill was seen from the bilateral ureteral orifices.  The bladder was intact.   The apex of the cystocele grasped with two allises.  Allises were used to grasp the vaginal mucosa in the midline superior to inferior just below the urethral opening.  The mucosa was injected with 1% lidocaine with epi in the midline and a scalpel used to incise the vaginal mucosa vertically.  Allises were used to retract the vaginal mucosa as the vesico-vaginal fascia was removed from the mucosa with blunt and sharp dissection and adequate room midurethral to pelvic floor bilaterally was developed.  At this point on L a deeper defect near urethra was palpated and after checking patency of the urethra with the cysto, the area was reinforced with 3-0 vicryl.  Two small puncture incisions were then made two cm from midline just above the pubic symphysis.  The abdominal needles from  the Gynecare TVT set were introduced behind the pubic bone, through the pelvic floor and out the vagina carefully on each respective side, being careful to hug the posterior of the pubic bone.  The foley was removed and indigo carmine given.  Cystoscopy revealed excellent bilateral spill from each ureteral orifice and NO NEEDLES IN THE BLADDER.   The urethra was clear as well.  The foley was replaced and the vaginal needles attached to the abdominal needles.  The tape was pulled into place through the abdominal incisions, the sheaths removed while a kelly was in place under the mesh sling and the tape cut at the level of just below the skin.  The tape was checked and found to be tight enough and laying flat.   Gelfoam  was placed in the corners up by the pelvic floor to achieve hemostasis of some small bleeders out of reach and too close to the sling for stitches.  Once hemostasis was achieved, one mattress stitch  of 0-vicryl were placed to close the vesico-vaginal fascia under the bladder.  The vaginal mucosa was trimmed and then closed with a running lock stitch of 2-0 vicryl.  A vaginal packing with estrace was placed and the patient returned to the recovery room in stable condition.     The patient tolerated the procedure well and was returned to the recovery room in stable condition.

## 2015-05-23 NOTE — Progress Notes (Signed)
There has been no change in the patients history, status or exam since the history and physical.  Filed Vitals:   05/23/15 0903  BP: 141/90  Pulse: 87  Temp: 98.8 F (37.1 C)  Resp: 20  SpO2: 100%    Lab Results  Component Value Date   WBC 7.6 05/15/2015   HGB 9.6* 05/15/2015   HCT 30.7* 05/15/2015   MCV 76.0* 05/15/2015   PLT 432* 05/15/2015   UPT neg  Kristin Skinner A

## 2015-05-23 NOTE — Brief Op Note (Deleted)
05/23/2015  10:34 AM  PATIENT:  Kristin Skinner  44 y.o. female  PRE-OPERATIVE DIAGNOSIS:  FIBROIDS  POST-OPERATIVE DIAGNOSIS:  * No post-op diagnosis entered *  PROCEDURE:  Procedure(s): ROBOTIC ASSISTED TOTAL HYSTERECTOMY WITH POSSIBLE BILATERAL SALPINGO OOPHORECTOMY (Bilateral) BILATERAL SALPINGECTOMY (Bilateral) CYSTOSCOPY (N/A)  SURGEON:  Surgeon(s) and Role:    * Bobbye Charleston, MD - Primary  PHYSICIAN ASSISTANT:   ASSISTANTS: Dr. Deatra Ina   ANESTHESIA:   general  EBL:   100 cc  LOCAL MEDICATIONS USED:  OTHER Ropivicaine  SPECIMEN:  Source of Specimen:  uterus, cervix, tubes and ovaries  DISPOSITION OF SPECIMEN:  PATHOLOGY  COUNTS:  YES  TOURNIQUET:  * No tourniquets in log *  DICTATION: .Note written in EPIC  PLAN OF CARE: Admit for overnight observation  PATIENT DISPOSITION:  PACU - hemodynamically stable.   Delay start of Pharmacological VTE agent (>24hrs) due to surgical blood loss or risk of bleeding: not applicable  Complications:  None.  Findings: 9 weeks size uterus.  Ovaries were normal bilaterally.  The ureters were identified during multiple points of the case and were always out of the field of dissection.  On cystoscopy, the bladder was intact and bilateral spill was seen from each ureteral oriface.  On dissection of the vaginal mucosa off the vesicovaginal tissue, an area was dissected very closely to the L urethra- immediate cystoscopy proved that the urethra was intact and the area was reinforced with 3-0 vicryl before proceeding with TVT.  Medications:  Ancef. Pyridium preop. Exparel  intra peritoneal.  Technique:  After adequate anesthesia was achieved the patient was positioned, prepped and draped in usual sterile fashion.  A speculum was placed in the vagina and the cervix dilated with pratt dilators.  The 8 cm Rumi and 3 cm Koh ring were assembled and placed in proper fashion.  The  Speculum was removed and the bladder catheterized  with a foley.    Attention was turned to the abdomen where a 1 cm incision was made above the umbilicus.  The veress needle was inserted without aspiration of bowel contents or blood.  The long 12 mm trocar was placed and the other three trocar sites were marked out, all approximately 10 cm from each other and the camera. Two 8.5 mm trocars were placed on either side of the camera port.  A 5 mm assistant port was placed 3 cm above the left iliac crest.  All trocars were inserted under direct visualization of the camera.  The patient was placed in trendelenburg and then the Robot docked.  The PK forceps were placed on arm 2 and the Hot shears on arm 1 and introduced under direct visualization of the camera.   I then broke scrub and sat down at the console.  The above findings were noted and the ureters identified well out of the field of dissection.  The right round ligament was identified and cauterized with the PK.  It  was then divided with the PK cautery and shears.  The peritoneum parallel to the IP ligament was carefully incised and the posterior broad ligament was then incised under the right IP ligament with care to be above the ureter.  The R ureter was dissected and followed to the junction with the uterine artery.  The R IP ligament was cauterized with the PK and then incised with the shears.  The Broad and cardinal ligaments were then skeletonized against the cervix to the level of the Koh ring, exposing the uterine  artery.  The uterine artery and branches were cauterized with the PK and incised with the shears.  The anterior leaf was then incised at the reflection of the vessico-uterine junction and the lateral bladder retracted inferiorly after the round ligament had been divided with the PK forceps.  The round ligament was cauterized with the PK and divided with the shears;  then the left IP ligament divided with the PK forceps and the scissors in the same manner as the right. The broad and  cardinal ligaments were then skeletonized and cauterized on the left in the same way.  The ureters were identified well out of the field of dissection.   The anterior peritoneum was tented up and incised with the monopolar and the bladder retracted inferiorly.  The vesicovaginal fascia was incised with the monopolar shears and then pushed inferiorly off the vagina to about one cm below the Koh ring.   The uterus was amputated at the level of the reflection of the vagina onto the cervix with monopolar cautery and then removed through the vagina.  The pedicles were checked with the gas pressure down and found to be hemostatic.  The vaginal cuff was closed with a running stitch of 2-0 V-Loc.  The Robot was undocked and Ropvicaine instilled inside the peritoneal cavity at the cuff.  All instruments and trocars were withdrawn from the abdomen, the abdomen desufflated and the 12 mm fascial incision closed with a figure of eight stitch of 2-0 vicryl.  The skin incisions were closed with 4-0 vicryl R and dermabond.  The cystoscope was placed in the bladder and good spill was seen from the bilateral ureteral orifices.  The bladder was intact.   The apex of the cystocele grasped with two allises.  Allises were used to grasp the vaginal mucosa in the midline superior to inferior just below the urethral opening.  The mucosa was injected with 1% lidocaine with epi in the midline and a scalpel used to incise the vaginal mucosa vertically.  Allises were used to retract the vaginal mucosa as the vesico-vaginal fascia was removed from the mucosa with blunt and sharp dissection and adequate room midurethral to pelvic floor bilaterally was developed.  At this point on L a deeper defect near urethra was palpated and after checking patency of the urethra with the cysto, the area was reinforced with 3-0 vicryl.  Two small puncture incisions were then made two cm from midline just above the pubic symphysis.  The abdominal needles from  the Gynecare TVT set were introduced behind the pubic bone, through the pelvic floor and out the vagina carefully on each respective side, being careful to hug the posterior of the pubic bone.  The foley was removed and indigo carmine given.  Cystoscopy revealed excellent bilateral spill from each ureteral orifice and NO NEEDLES IN THE BLADDER.   The urethra was clear as well.  The foley was replaced and the vaginal needles attached to the abdominal needles.  The tape was pulled into place through the abdominal incisions, the sheaths removed while a kelly was in place under the mesh sling and the tape cut at the level of just below the skin.  The tape was checked and found to be tight enough and laying flat.   Gelfoam  was placed in the corners up by the pelvic floor to achieve hemostasis of some small bleeders out of reach and too close to the sling for stitches.  Once hemostasis was achieved, one mattress stitch  of 0-vicryl were placed to close the vesico-vaginal fascia under the bladder.  The vaginal mucosa was trimmed and then closed with a running lock stitch of 2-0 vicryl.  A vaginal packing with estrace was placed and the patient returned to the recovery room in stable condition.     The patient tolerated the procedure well and was returned to the recovery room in stable condition.

## 2015-05-23 NOTE — Anesthesia Preprocedure Evaluation (Addendum)
Anesthesia Evaluation  Patient identified by MRN, date of birth, ID band Patient awake    Reviewed: Allergy & Precautions, NPO status , Patient's Chart, lab work & pertinent test results  Airway Mallampati: III  TM Distance: >3 FB Neck ROM: Full    Dental no notable dental hx. (+) Teeth Intact   Pulmonary former smoker,    Pulmonary exam normal breath sounds clear to auscultation       Cardiovascular hypertension, Pt. on medications Normal cardiovascular exam Rhythm:Regular Rate:Normal     Neuro/Psych negative neurological ROS  negative psych ROS   GI/Hepatic negative GI ROS, Neg liver ROS,   Endo/Other  Morbid obesity  Renal/GU negative Renal ROS  negative genitourinary   Musculoskeletal negative musculoskeletal ROS (+)   Abdominal (+) + obese,   Peds  Hematology  (+) anemia ,   Anesthesia Other Findings   Reproductive/Obstetrics Fibroid uterus                            Anesthesia Physical Anesthesia Plan  ASA: III  Anesthesia Plan: General   Post-op Pain Management:    Induction: Intravenous  Airway Management Planned: Oral ETT  Additional Equipment:   Intra-op Plan:   Post-operative Plan: Extubation in OR  Informed Consent: I have reviewed the patients History and Physical, chart, labs and discussed the procedure including the risks, benefits and alternatives for the proposed anesthesia with the patient or authorized representative who has indicated his/her understanding and acceptance.   Dental advisory given  Plan Discussed with: CRNA, Anesthesiologist and Surgeon  Anesthesia Plan Comments:         Anesthesia Quick Evaluation

## 2015-05-23 NOTE — Brief Op Note (Signed)
05/23/2015  5:11 PM  PATIENT:  Kristin Skinner  44 y.o. female  PRE-OPERATIVE DIAGNOSIS:  Post operative bleeding from vagina.  POST-OPERATIVE DIAGNOSIS:  Same with small area of cuff dehiscence.  PROCEDURE:  Diagnostic laparoscopy with vaginal repair of cuff dehiscence. SURGEON:  Surgeon(s) and Role:    * Bobbye Charleston, MD - Primary  ANESTHESIA: continued  general from previous  EBL: see anethesia for IVF.  Blood loss from dehiscence about 100 cc.  BLOOD ADMINISTERED:none  DRAINS: none   LOCAL MEDICATIONS USED:  NONE  SPECIMEN:  No Specimen  DISPOSITION OF SPECIMEN:  N/A  COUNTS:  YES  TOURNIQUET:  * No tourniquets in log *  DICTATION: .Note written in EPIC  PLAN OF CARE: Admit for overnight observation  PATIENT DISPOSITION:  PACU - hemodynamically stable.   Delay start of Pharmacological VTE agent (>24hrs) due to surgical blood loss or risk of bleeding: not applicable  Complications: none.  Findings:  No active bleeding from vessels in pelvis.  2 cm area of dehiscence of vaginal cuff at angle on L.  V-Loc suture unraveled one turn- rest of cuff intact to palpation and exploration with ring forceps.  Reason for operation;  Shortly after I left the operating room from the first surgery and before the pt was awakened from the first cast, I was called back for brisk bleeding from the vagina.  Indeed there was a brisk trickle of BRB coming from the L side of the vaginal cuff.  During the previous operation, the cuff had been checked digitally and the abdomen checked several times with the gas down with no active bleeding.  I first inspected the vagina with a speculum and could see a brisk trickle of bright blood from the L cuff angle.  I could not identify a vessel from below.  Since I could not see where the bleeding was coming from now and elected to proceed with diagnostic laparoscopy in case a vessel inside the pelvis was no longer hemostatic.     Techniques:  Pt was prepped and draped again in a sterile fashion.  The foley had remained in place and a sponge stick was used to manipulate the vaginal cuff.  The 12 mm scope port was wiped of dermabond and the skin reopened.  I could not find the former suture line from the 12 scope to open; a veress needle was tried 2 times without peritoneal insufflation.  An Optiview was then placed through the same skin incision but a new fascial defect made and the abdomen insufflated and the camera introduced.  The right sided 8.5 port skin was opened and a 5 port placed. The cuff and lower peritoneum were irrigated and watched.  The gas was turned down to 8 again and still we watched.  A systematic evaluation of the pedicles and peritoneal edges was made with no sign of bleeding.  I moved to the vagina to examine it while watching from above and noted a 3 cm area of open cuff at the L angle.  Upon having the scrub tech expose the posterior vagina and cul de sac, a 3 cm length of V loc had become unraveled from the cuff.  Bleeding, although much less, could still be seen from the L angle of the cuff.  Two figure of eight stitches were placed from the vagina into the cuff to close the angle and bleeding stopped.  I redressed and went back to the abdomen and again inspected the pedicles, cuff and  peritoneum- no active bleeding was seen.  An inspection with ring forceps was performed of the cuff from the vagina and no areas of dehiscence were seen.  No blood from the vagina was seen either.  The instruments were removed from the abdomen, the abdomen desufflated and the the fascial incision was closed with a 2- vicryl figure of eight.  The skin incisions were closed with 4-0 vicryl and dermabond.  The vagina was clear and hemostatic.  The pt was returned to the recovery room in stable condition.

## 2015-05-24 DIAGNOSIS — D259 Leiomyoma of uterus, unspecified: Secondary | ICD-10-CM | POA: Diagnosis not present

## 2015-05-24 LAB — CBC
HCT: 25.2 % — ABNORMAL LOW (ref 36.0–46.0)
HEMOGLOBIN: 7.8 g/dL — AB (ref 12.0–15.0)
MCH: 23.7 pg — ABNORMAL LOW (ref 26.0–34.0)
MCHC: 31 g/dL (ref 30.0–36.0)
MCV: 76.6 fL — ABNORMAL LOW (ref 78.0–100.0)
Platelets: 441 10*3/uL — ABNORMAL HIGH (ref 150–400)
RBC: 3.29 MIL/uL — ABNORMAL LOW (ref 3.87–5.11)
RDW: 17.5 % — AB (ref 11.5–15.5)
WBC: 17 10*3/uL — ABNORMAL HIGH (ref 4.0–10.5)

## 2015-05-24 MED ORDER — OXYCODONE-ACETAMINOPHEN 5-325 MG PO TABS: 1.0000 | ORAL_TABLET | ORAL | Status: AC | PRN

## 2015-05-24 MED ORDER — ONDANSETRON HCL 8 MG PO TABS: 8.0000 mg | ORAL_TABLET | Freq: Three times a day (TID) | ORAL | Status: AC | PRN

## 2015-05-24 NOTE — Discharge Summary (Signed)
Physician Discharge Summary  Patient ID: Kristin Skinner MRN: [id number] DOB/AGE: [birth year] 44 y.o.  Admit date: 05/23/2015 Discharge date: 05/24/2015  Admission Diagnoses:symptomatic fibroid uterus.    Discharge Diagnoses: same Active Problems:   Postoperative state   Discharged Condition: good  Hospital Course: Pt underwent uncomplicated TLH/BS/Cysto but before leaving the OR was noted to have bleeding from vagina.  Pt underwent a second laparoscopy with repair of vaginal cuff dehiscence.  Postop course was uncomplicated.  Consults: None  Significant Diagnostic Studies: labs:  Results for orders placed or performed during the hospital encounter of 05/23/15 (from the past 24 hour(s))  Prepare RBC (crossmatch)     Status: None   Collection Time: 05/23/15 11:07 AM  Result Value Ref Range   Order Confirmation ORDER PROCESSED BY BLOOD BANK   CBC     Status: Abnormal   Collection Time: 05/24/15  5:20 AM  Result Value Ref Range   WBC 17.0 (H) 4.0 - 10.5 K/uL   RBC 3.29 (L) 3.87 - 5.11 MIL/uL   Hemoglobin 7.8 (L) 12.0 - 15.0 g/dL   HCT 25.2 (L) 36.0 - 46.0 %   MCV 76.6 (L) 78.0 - 100.0 fL   MCH 23.7 (L) 26.0 - 34.0 pg   MCHC 31.0 30.0 - 36.0 g/dL   RDW 17.5 (H) 11.5 - 15.5 %   Platelets 441 (H) 150 - 400 K/uL    Treatments: surgery: Robotic TLH/BS with cysto; dx laparoscopy with repair of cuff dehiscence.  Discharge Exam: Blood pressure 138/75, pulse 99, temperature 99.3 F (37.4 C), temperature source Oral, resp. rate 18, height 5' 9"  (1.753 m), weight 151.964 kg (335 lb 0.3 oz), SpO2 99 %.   Disposition: Final discharge disposition not confirmed  Discharge Instructions    Call MD for:  temperature >100.4    Complete by:  As directed      Diet - low sodium heart healthy    Complete by:  As directed      Discharge instructions    Complete by:  As directed   No driving on narcotics, no sexual activity for 2 weeks.     Increase activity slowly    Complete by:  As  directed      May shower / Bathe    Complete by:  As directed   Shower, no bath for 2 weeks.     Remove dressing in 24 hours    Complete by:  As directed      Sexual Activity Restrictions    Complete by:  As directed   No sexual activity for 2 weeks.            Medication List    TAKE these medications        B-12 1000 MCG/ML Kit  Inject 1 mL as directed every 30 (thirty) days.     ergocalciferol 50000 UNITS capsule  Commonly known as:  VITAMIN D2  Take 50,000 Units by mouth once a week. Sundays     ferrous sulfate 325 (65 FE) MG tablet  Take 325 mg by mouth daily with breakfast.     lisinopril 10 MG tablet  Commonly known as:  PRINIVIL,ZESTRIL  Take 10 mg by mouth daily.     norethindrone 5 MG tablet  Commonly known as:  AYGESTIN  Take 2.5 mg by mouth daily.     oxyCODONE-acetaminophen 5-325 MG tablet  Commonly known as:  PERCOCET/ROXICET  Take 1-2 tablets by mouth every 4 (four) hours as needed for severe pain (  moderate to severe pain (when tolerating fluids)).           Follow-up Information    Follow up with Jajuan Skoog A, MD In 2 weeks.   Specialty:  Obstetrics and Gynecology   Contact information:   [address] [place] [address] [phone]       Signed: Lesle Faron A 05/24/2015, 9:55 AM

## 2015-05-24 NOTE — Anesthesia Postprocedure Evaluation (Signed)
  Anesthesia Post-op Note  Patient: Kristin Skinner  Procedure(s) Performed: Procedure(s): ROBOTIC ASSISTED TOTAL HYSTERECTOMY  (Bilateral) BILATERAL SALPINGECTOMY (Bilateral) CYSTOSCOPY (N/A) LAPAROSCOPY DIAGNOSTIC WITH SUTURE OF VAGINAL CUFF (N/A)  Patient Location: [facility]  Anesthesia Type:General  Level of Consciousness: awake, alert  and oriented  Airway and Oxygen Therapy: Patient Spontanous Breathing  Post-op Pain: none  Post-op Assessment: Post-op Vital signs reviewed, Patient's Cardiovascular Status Stable, Respiratory Function Stable, Patent Airway, No signs of Nausea or vomiting, Adequate PO intake, Pain level controlled, No headache and Patient able to bend at knees              Post-op Vital Signs: Reviewed and stable  Last Vitals:  Filed Vitals:   05/24/15 0400  BP: 138/75  Pulse: 99  Temp: 37.4 C  Resp: 18    Complications: No apparent anesthesia complications

## 2015-05-24 NOTE — Addendum Note (Signed)
Addendum  created 05/24/15 0719 by Riki Sheer, CRNA   Modules edited: Notes Section   Notes Section:  File: [id number]

## 2015-05-24 NOTE — Progress Notes (Signed)
Patient is eating, ambulating, not voiding yet.  Pain control is good.  BP 138/75 mmHg  Pulse 99  Temp(Src) 99.3 F (37.4 C) (Oral)  Resp 18  Ht 5\' 9"  (1.753 m)  Wt 151.964 kg (335 lb 0.3 oz)  BMI 49.45 kg/m2  SpO2 99%  lungs:   clear to auscultation cor:    RRR Abdomen:  soft, appropriate tenderness, incisions intact and without erythema or exudate. ex:    no cords   Lab Results  Component Value Date   WBC 17.0* 05/24/2015   HGB 7.8* 05/24/2015   HCT 25.2* 05/24/2015   MCV 76.6* 05/24/2015   PLT 441* 05/24/2015    A/P  Routine care.  Expect d/c per plan.  H/H down from 9.6;  No additional bleeding.

## 2015-05-24 NOTE — Progress Notes (Signed)
Pt ambulated out teaching complete  

## 2015-05-24 NOTE — Progress Notes (Signed)
D/c foley

## 2015-05-25 ENCOUNTER — Encounter (HOSPITAL_COMMUNITY): Payer: Self-pay | Admitting: Obstetrics and Gynecology

## 2015-05-30 LAB — TYPE AND SCREEN
ABO/RH(D): O POS
ANTIBODY SCREEN: NEGATIVE
UNIT DIVISION: 0
Unit division: 0

## 2016-09-09 ENCOUNTER — Other Ambulatory Visit: Payer: Self-pay | Admitting: Obstetrics and Gynecology

## 2016-09-10 LAB — CYTOLOGY - PAP

## 2017-11-05 ENCOUNTER — Other Ambulatory Visit: Payer: Self-pay | Admitting: Obstetrics and Gynecology

## 2017-11-05 DIAGNOSIS — R928 Other abnormal and inconclusive findings on diagnostic imaging of breast: Secondary | ICD-10-CM

## 2017-11-10 ENCOUNTER — Ambulatory Visit: Payer: BLUE CROSS/BLUE SHIELD

## 2017-11-10 ENCOUNTER — Ambulatory Visit
Admission: RE | Admit: 2017-11-10 | Discharge: 2017-11-10 | Disposition: A | Discharge status: Home / Self Care | Payer: BLUE CROSS/BLUE SHIELD | Source: Ambulatory Visit | Attending: Obstetrics and Gynecology | Admitting: Obstetrics and Gynecology

## 2017-11-10 DIAGNOSIS — R928 Other abnormal and inconclusive findings on diagnostic imaging of breast: Secondary | ICD-10-CM

## 2019-02-06 IMAGING — MG DIGITAL DIAGNOSTIC UNILATERAL RIGHT MAMMOGRAM WITH TOMO AND CAD
6 series · 6 of 18 positions shown · non-contrast
Comparison: 2D screening mammogram obtained at [facility]
on 10/29/2017.

CLINICAL DATA: Possible asymmetry in the superior right breast on
the oblique view of a recent 2D screening mammogram.

EXAM:
DIGITAL DIAGNOSTIC UNILATERAL RIGHT MAMMOGRAM WITH CAD AND TOMO

[R CC synth-2D (1 of 2)]
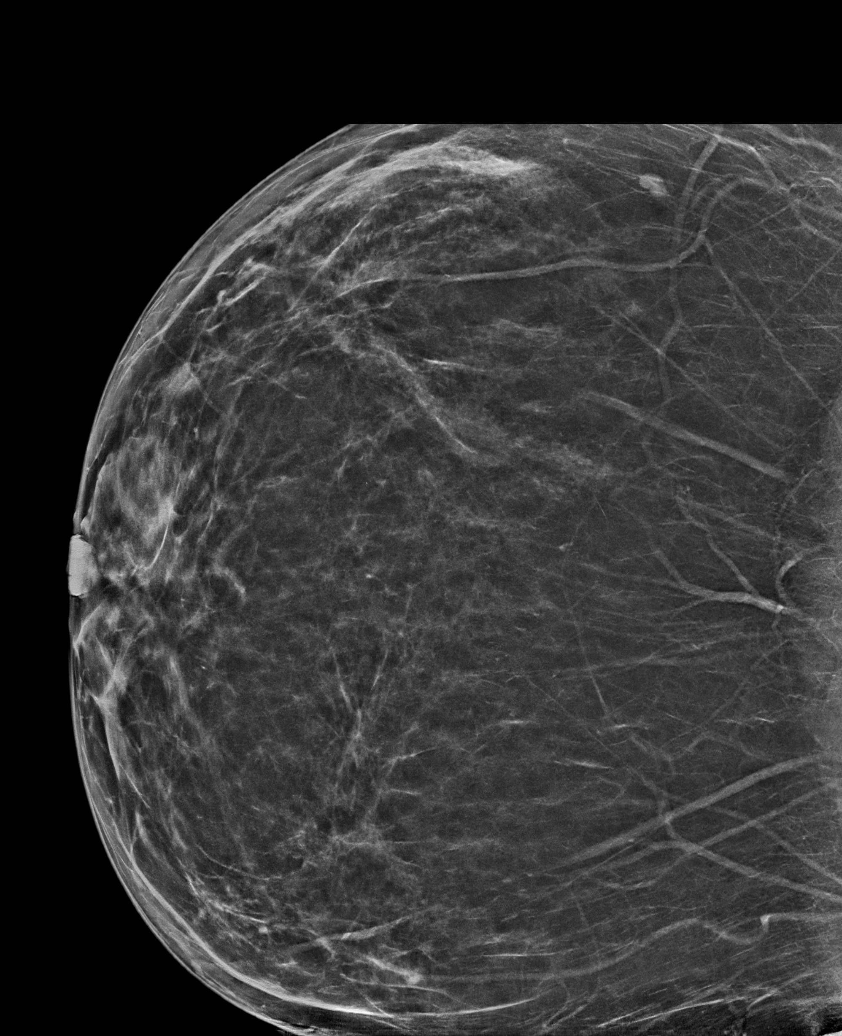

[R MLO synth-2D]
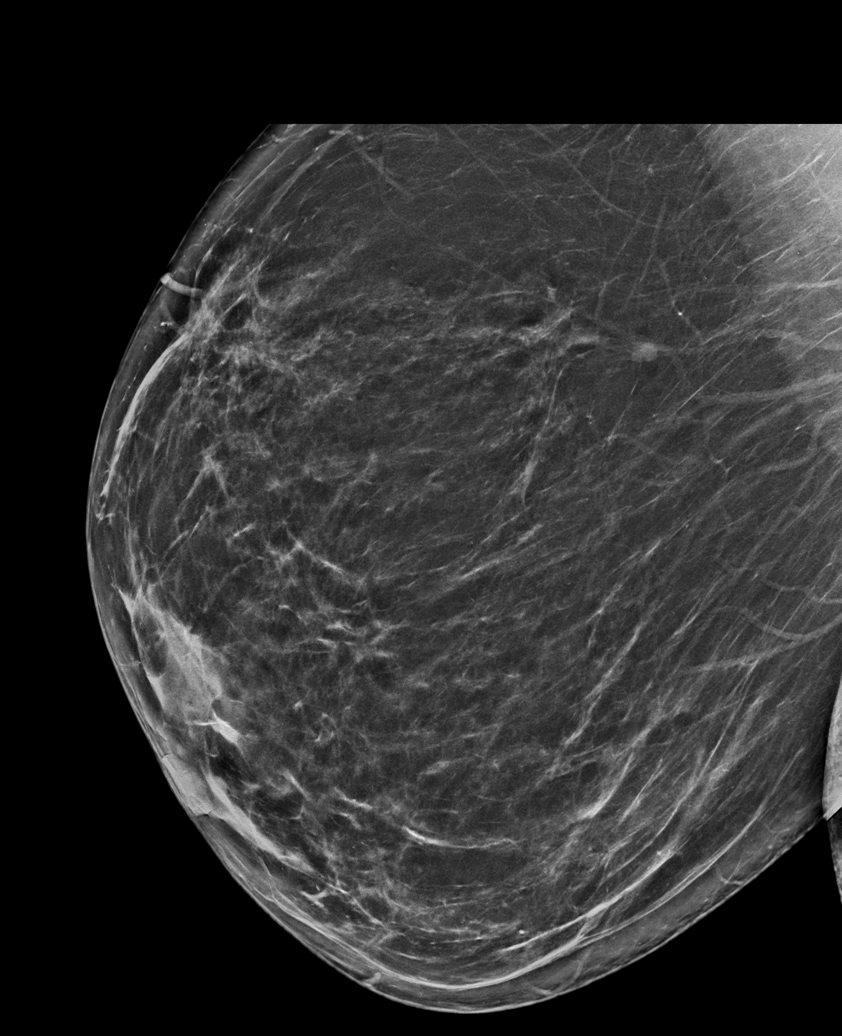

[R CC synth-2D (2 of 2)]
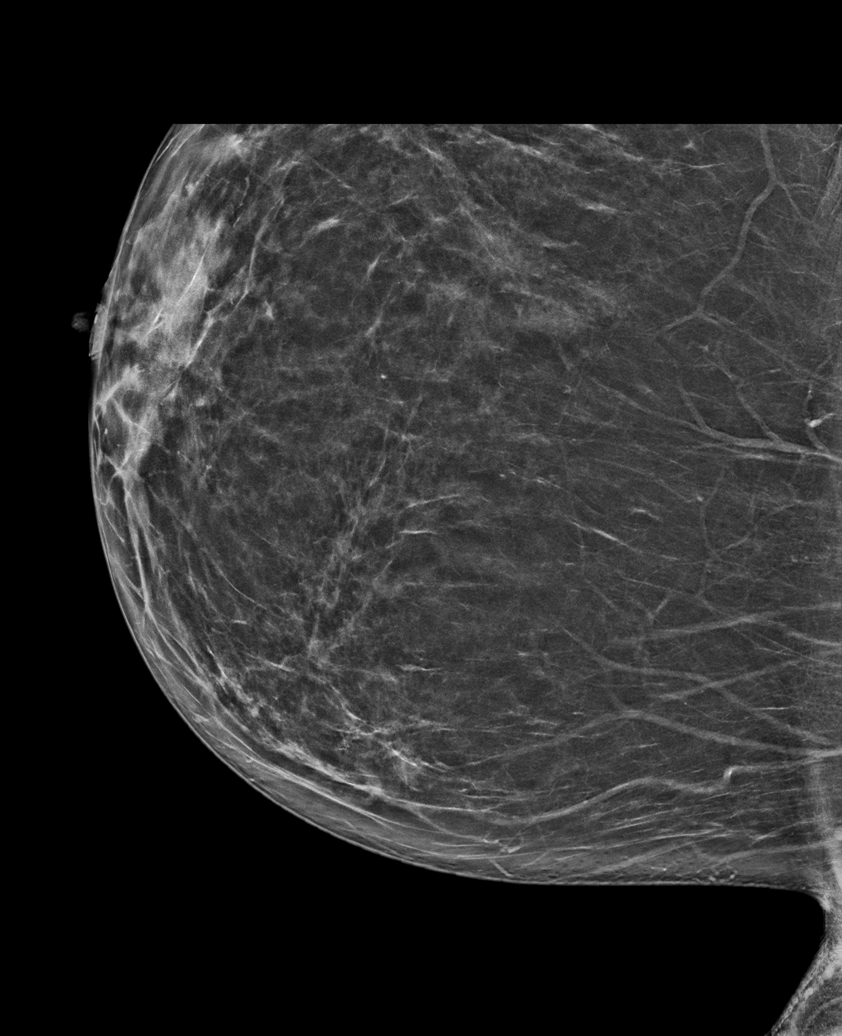

[R CC tomo (1 of 2) · tomo slice 43/84.0]
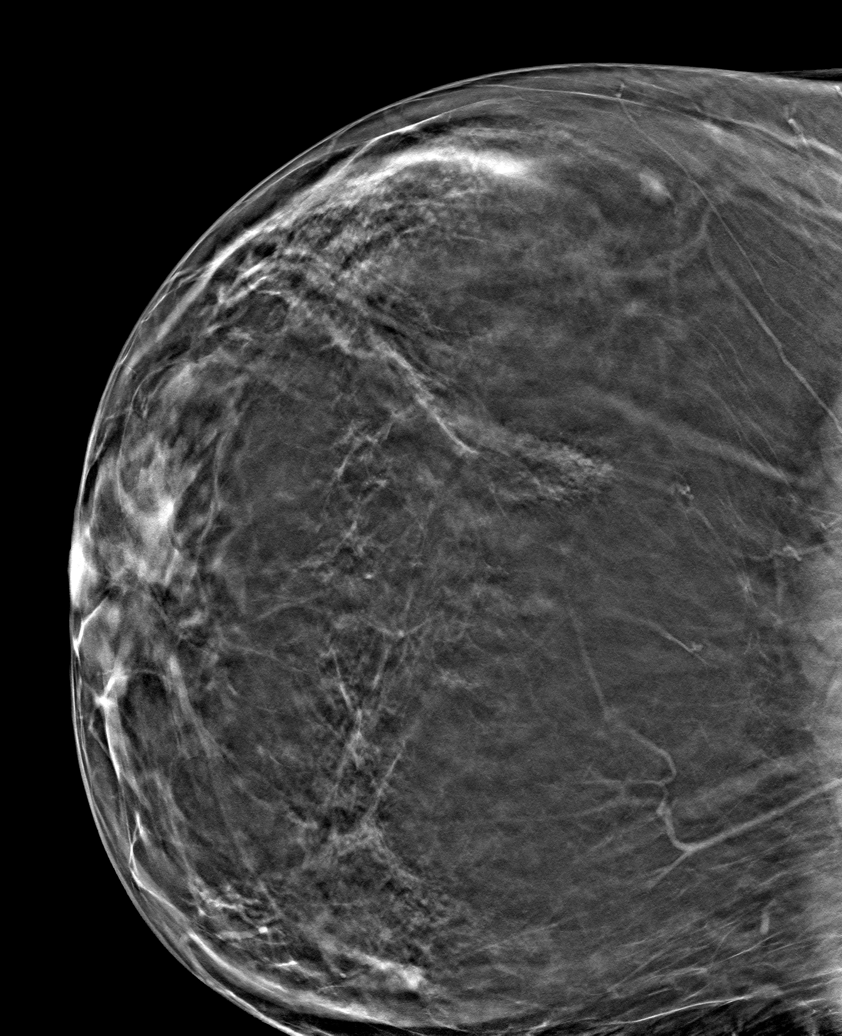

[R MLO tomo · tomo slice 47/92.0]
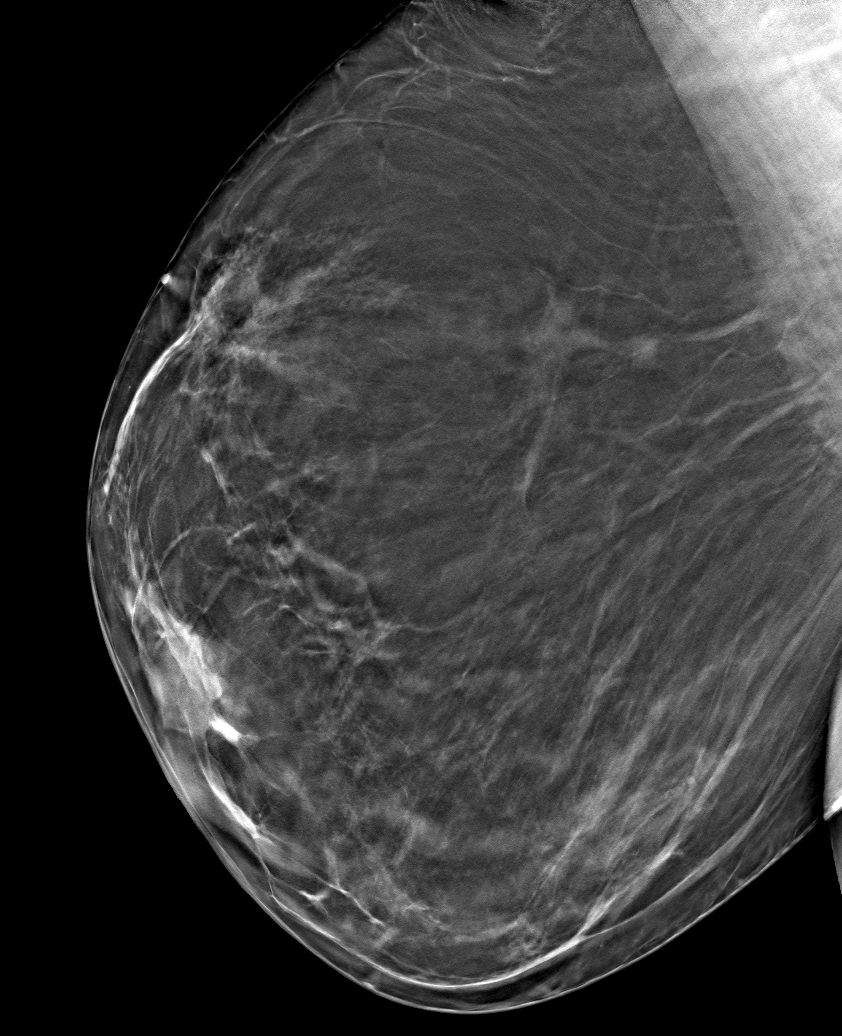

[R CC tomo (2 of 2) · tomo slice 43/85.0]
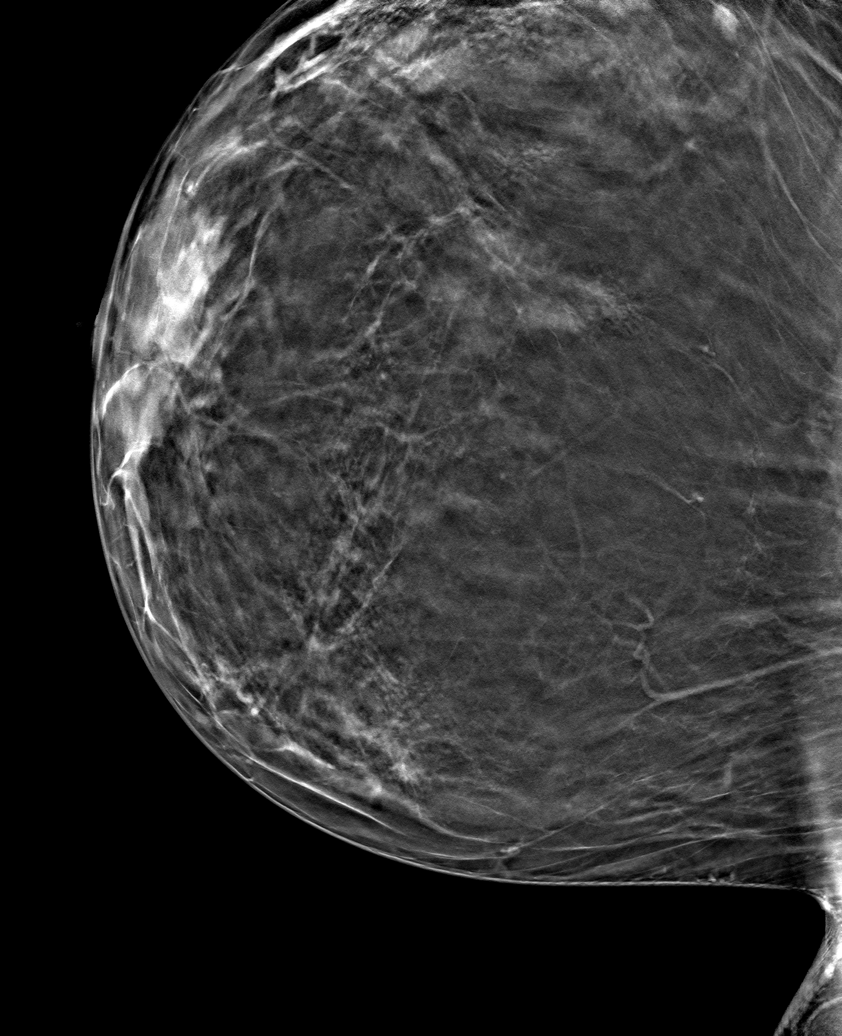

[6 of 18 positions shown; findings below may reference images not displayed]

ACR Breast Density Category b: There are scattered areas of
fibroglandular density.
FINDINGS: 3D tomographic and 2D generated mammogram views of the right breast
were obtained. These demonstrate normal appearing fibroglandular
tissue at the location of recently suspected asymmetry.

Mammographic images were processed with CAD.
IMPRESSION: No evidence of malignancy. The recently suspected right breast
asymmetry was overlapping of normal breast tissue.

RECOMMENDATION:
Bilateral screening mammogram in 1 year.

I have discussed the findings and recommendations with the patient.
Results were also provided in writing at the conclusion of the
visit. If applicable, a reminder letter will be sent to the patient
regarding the next appointment.

BI-RADS CATEGORY  1: Negative.
